# Patient Record
Sex: Male | Born: 2005
Health system: Southern US, Community
[De-identification: ages and names within clinical notes are randomized; demographics above are authoritative.]

---
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
## 2024-03-30 ENCOUNTER — Emergency Department (HOSPITAL_COMMUNITY): Admission: EM | Admit: 2024-03-30 | Discharge: 2024-03-30 | Disposition: A | Discharge status: Home / Self Care

## 2024-03-30 ENCOUNTER — Other Ambulatory Visit: Payer: Self-pay

## 2024-03-30 ENCOUNTER — Encounter (HOSPITAL_COMMUNITY): Payer: Self-pay

## 2024-03-30 ENCOUNTER — Emergency Department (HOSPITAL_COMMUNITY)

## 2024-03-30 DIAGNOSIS — N451 Epididymitis: Secondary | ICD-10-CM | POA: Insufficient documentation

## 2024-03-30 DIAGNOSIS — N50812 Left testicular pain: Secondary | ICD-10-CM | POA: Diagnosis present

## 2024-03-30 LAB — BASIC METABOLIC PANEL WITH GFR
Anion gap: 11 (ref 5–15)
BUN: 12 mg/dL (ref 6–20)
CO2: 25 mmol/L (ref 22–32)
Calcium: 9.6 mg/dL (ref 8.9–10.3)
Chloride: 103 mmol/L (ref 98–111)
Creatinine, Ser: 0.85 mg/dL (ref 0.61–1.24)
GFR, Estimated: >60 mL/min
Glucose, Bld: 109 mg/dL — ABNORMAL HIGH (ref 70–99)
Potassium: 4 mmol/L (ref 3.5–5.1)
Sodium: 140 mmol/L (ref 135–145)

## 2024-03-30 LAB — CBC WITH DIFFERENTIAL/PLATELET
Abs Immature Granulocytes: 0.03 K/uL (ref 0.00–0.07)
Basophils Absolute: 0 K/uL (ref 0.0–0.1)
Basophils Relative: 0 %
Eosinophils Absolute: 0 K/uL (ref 0.0–0.5)
Eosinophils Relative: 0 %
HCT: 43.1 % (ref 39.0–52.0)
Hemoglobin: 15.3 g/dL (ref 13.0–17.0)
Immature Granulocytes: 0 %
Lymphocytes Relative: 14 %
Lymphs Abs: 1.5 K/uL (ref 0.7–4.0)
MCH: 30.8 pg (ref 26.0–34.0)
MCHC: 35.5 g/dL (ref 30.0–36.0)
MCV: 86.7 fL (ref 80.0–100.0)
Monocytes Absolute: 0.6 K/uL (ref 0.1–1.0)
Monocytes Relative: 6 %
Neutro Abs: 8.1 K/uL — ABNORMAL HIGH (ref 1.7–7.7)
Neutrophils Relative %: 80 %
Platelets: 274 K/uL (ref 150–400)
RBC: 4.97 MIL/uL (ref 4.22–5.81)
RDW: 12.5 % (ref 11.5–15.5)
WBC: 10.3 K/uL (ref 4.0–10.5)
nRBC: 0 % (ref 0.0–0.2)

## 2024-03-30 LAB — URINALYSIS, ROUTINE W REFLEX MICROSCOPIC
Bilirubin Urine: NEGATIVE
Glucose, UA: NEGATIVE mg/dL
Hgb urine dipstick: NEGATIVE
Ketones, ur: NEGATIVE mg/dL
Leukocytes,Ua: NEGATIVE
Nitrite: NEGATIVE
Protein, ur: 30 mg/dL — AB
Specific Gravity, Urine: 1.02 (ref 1.005–1.030)
pH: 9 — ABNORMAL HIGH (ref 5.0–8.0)

## 2024-03-30 MED ORDER — DOXYCYCLINE HYCLATE 100 MG PO TABS
100.0000 mg | ORAL_TABLET | Freq: Once | ORAL | Status: AC
  Administered 2024-03-30: 100 mg via ORAL
  Filled 2024-03-30: qty 1

## 2024-03-30 MED ORDER — LIDOCAINE HCL (PF) 1 % IJ SOLN
INTRAMUSCULAR | Status: AC
  Administered 2024-03-30: 5 mL
  Filled 2024-03-30: qty 5

## 2024-03-30 MED ORDER — DOXYCYCLINE HYCLATE 100 MG PO CAPS: 100.0000 mg | ORAL_CAPSULE | Freq: Two times a day (BID) | ORAL | 0 refills | Status: DC

## 2024-03-30 MED ORDER — CEFTRIAXONE SODIUM 1 G IJ SOLR
1.0000 g | Freq: Once | INTRAMUSCULAR | Status: AC
  Administered 2024-03-30: 1 g via INTRAMUSCULAR
  Filled 2024-03-30: qty 10

## 2024-03-30 MED ORDER — HYDROCODONE-ACETAMINOPHEN 5-325 MG PO TABS
1.0000 | ORAL_TABLET | Freq: Once | ORAL | Status: AC
  Administered 2024-03-30: 1 via ORAL
  Filled 2024-03-30: qty 1

## 2024-03-30 NOTE — ED Provider Triage Note (Signed)
 Emergency Medicine Provider Triage Evaluation Note  Lucas Johnson , a 19 y.o. male  was evaluated in triage.  Pt complains of left testicle pain onset 11am this morning, constant, no changes in bowel or bladder habits.  Review of Systems  Positive:  Negative:   Physical Exam  BP (!) 140/75 (BP Location: Right Arm)   Pulse 88   Temp 97.7 F (36.5 C)   Resp 16   Ht 5' 11 (1.803 m)   Wt 79.4 kg   SpO2 100%   BMI 24.41 kg/m  Gen:   Awake, no distress   Resp:  Normal effort  MSK:   Moves extremities without difficulty  Other:    Medical Decision Making  Medically screening exam initiated at 1:26 PM.  Appropriate orders placed.  Lucas Johnson was informed that the remainder of the evaluation will be completed by another provider, this initial triage assessment does not replace that evaluation, and the importance of remaining in the ED until their evaluation is complete.     Beverley Leita LABOR, PA-C 03/30/24 1327

## 2024-03-30 NOTE — ED Provider Notes (Signed)
 " [place] [facility] AT [facility] Provider Note   CSN: [id number] Arrival date & time: 03/30/24  1247     Patient presents with: Abdominal Pain   Lucas Johnson is a 19 y.o. male.   19 year old male presents for evaluation of left lower quadrant abdominal pain and left testicular pain that started acutely today.  He states that slightly improved.  Denies any dysuria.  Denies any nausea or vomiting or diarrhea.  Denies any other symptoms or concerns.   Abdominal Pain Associated symptoms: no chest pain, no chills, no cough, no dysuria, no fever, no hematuria, no shortness of breath, no sore throat and no vomiting        Prior to Admission medications  Medication Sig Start Date End Date Taking? Authorizing Provider  doxycycline (VIBRAMYCIN) 100 MG capsule Take 1 capsule (100 mg total) by mouth 2 (two) times daily for 7 days. 03/30/24 04/06/24 Yes Areebah Meinders L, DO  Cetirizine  HCl 10 MG CAPS Take 1 capsule (10 mg total) by mouth daily for 10 days. 05/21/20 05/31/20  Wieters, Hallie C, PA-C  fluticasone  (FLONASE ) 50 MCG/ACT nasal spray Place 1-2 sprays into both nostrils daily. 05/21/20   Wieters, Hallie C, PA-C  ibuprofen  (ADVIL ) 400 MG tablet Take 1 tablet (400 mg total) by mouth every 6 (six) hours as needed. 05/21/20   Wieters, Hallie C, PA-C    Allergies: Patient has no known allergies.    Review of Systems  Constitutional:  Negative for chills and fever.  HENT:  Negative for ear pain and sore throat.   Eyes:  Negative for pain and visual disturbance.  Respiratory:  Negative for cough and shortness of breath.   Cardiovascular:  Negative for chest pain and palpitations.  Gastrointestinal:  Positive for abdominal pain. Negative for vomiting.  Genitourinary:  Negative for dysuria and hematuria.  Musculoskeletal:  Negative for arthralgias and back pain.  Skin:  Negative for color change and rash.  Neurological:  Negative for seizures and syncope.  All other  systems reviewed and are negative.   Updated Vital Signs BP 130/64 (BP Location: Left Arm)   Pulse 77   Temp 98.7 F (37.1 C)   Resp 16   Ht 5' 11 (1.803 m)   Wt 79.4 kg   SpO2 100%   BMI 24.41 kg/m   Physical Exam Vitals and nursing note reviewed.  Constitutional:      General: He is not in acute distress.    Appearance: He is well-developed.  HENT:     Head: Normocephalic and atraumatic.  Eyes:     Conjunctiva/sclera: Conjunctivae normal.  Cardiovascular:     Rate and Rhythm: Normal rate and regular rhythm.     Heart sounds: No murmur heard. Pulmonary:     Effort: Pulmonary effort is normal. No respiratory distress.     Breath sounds: Normal breath sounds.  Abdominal:     Palpations: Abdomen is soft.     Tenderness: There is abdominal tenderness in the left lower quadrant.  Musculoskeletal:        General: No swelling.     Cervical back: Neck supple.  Skin:    General: Skin is warm and dry.     Capillary Refill: Capillary refill takes less than 2 seconds.  Neurological:     Mental Status: He is alert.  Psychiatric:        Mood and Affect: Mood normal.     (all labs ordered are listed, but only abnormal  results are displayed) Labs Reviewed  CBC WITH DIFFERENTIAL/PLATELET - Abnormal; Notable for the following components:      Result Value   Neutro Abs 8.1 (*)    All other components within normal limits  BASIC METABOLIC PANEL WITH GFR - Abnormal; Notable for the following components:   Glucose, Bld 109 (*)    All other components within normal limits  URINALYSIS, ROUTINE W REFLEX MICROSCOPIC - Abnormal; Notable for the following components:   APPearance TURBID (*)    pH 9.0 (*)    Protein, ur 30 (*)    Bacteria, UA RARE (*)    All other components within normal limits    EKG: None  Radiology: US  SCROTUM W/DOPPLER Result Date: 03/30/2024 CLINICAL DATA:  Left testicle pain EXAM: SCROTAL ULTRASOUND DOPPLER ULTRASOUND OF THE TESTICLES TECHNIQUE:  Complete ultrasound examination of the testicles, epididymis, and other scrotal structures was performed. Color and spectral Doppler ultrasound were also utilized to evaluate blood flow to the testicles. COMPARISON:  None Available. FINDINGS: Right testicle Measurements: 4.3 x 2.1 x 3.3 cm. No mass or microlithiasis visualized. Doppler: There is normal vascularity on color doppler examination. Spectral doppler arterial and venous waveforms are normal. Left testicle Measurements:  4.1 x 2.5 x 3.7 cm. No mass or microlithiasis visualized. Doppler: There is normal vascularity on color doppler examination. Spectral doppler arterial and venous waveforms are normal. Right epididymis:  Normal in size and appearance. Left epididymis: Slightly prominent in size. Heterogenous thickened appearance of the left spermatic cord with slight hyperemia. Hydrocele:  None visualized. Varicocele:  None visualized. IMPRESSION: 1. Negative for testicular torsion or intratesticular mass. 2. Slightly prominent left epididymis with heterogenous thickened appearance of the left spermatic cord with slight hyperemia, findings could be seen with mild epididymitis/funiculitis Electronically Signed   By: Luke Bun M.D.   On: 03/30/2024 15:55     Procedures   Medications Ordered in the ED  cefTRIAXone (ROCEPHIN) injection 1 g (has no administration in time range)  doxycycline (VIBRA-TABS) tablet 100 mg (has no administration in time range)  HYDROcodone-acetaminophen  (NORCO/VICODIN) 5-325 MG per tablet 1 tablet (1 tablet Oral Given 03/30/24 1346)                                    Medical Decision Making Patient with normal lab workup but found to have epididymitis on his testicular ultrasound.  He appears well otherwise.  Advised Tylenol  Motrin  as needed for pain will give him Rocephin here and start him on doxycycline.  All results and plan discussed with patient and family at bedside.  He is advised close follow-up with his  primary care doctor and otherwise return to the ER for any new or worsening symptoms.  Patient and family feel comfortable with the plan for him to be discharged home.  Problems Addressed: Epididymitis: acute illness or injury  Amount and/or Complexity of Data Reviewed External Data Reviewed: notes.    Details: No prior ER records for review Labs: ordered. Decision-making details documented in ED Course.    Details: Ordered and reviewed by me and unremarkable Radiology: ordered and independent interpretation performed. Decision-making details documented in ED Course.    Details: Ordered and interpreted by me independently of radiology Testicular ultrasound shows evidence of epididymitis, but no testicular torsion  Risk OTC drugs. Prescription drug management.     Final diagnoses:  Epididymitis    ED Discharge Orders  Ordered    doxycycline (VIBRAMYCIN) 100 MG capsule  2 times daily        03/30/24 1801               Zanovia Rotz L, DO 03/30/24 1805  "

## 2024-03-30 NOTE — ED Notes (Signed)
 This NT gave pt a urine cup and pt stated they were not able to urinate at this time. Pt encouraged to give urine sample as soon as possible.

## 2024-03-30 NOTE — ED Triage Notes (Signed)
 Patient is having LLQ pain for the past 2 hours. Denies nausea, vomiting, diarrhea. He states he is unsure of if he has had a fever. Last BM was about a hour and half ago and it was normal.

## 2024-03-30 NOTE — ED Triage Notes (Signed)
 Patient endorses sudden onset of LLQ and left testicle pain starting around 11am. Patient denies strenuous activity and swelling.

## 2024-03-30 NOTE — Discharge Instructions (Signed)
 Take your antibiotics as prescribed.  You can use Tylenol  Motrin  as needed for pain.

## 2024-03-31 ENCOUNTER — Telehealth (HOSPITAL_COMMUNITY): Payer: Self-pay | Admitting: Emergency Medicine

## 2024-03-31 MED ORDER — DOXYCYCLINE HYCLATE 100 MG PO CAPS: 100.0000 mg | ORAL_CAPSULE | Freq: Two times a day (BID) | ORAL | 0 refills | Status: AC

## 2024-03-31 NOTE — Telephone Encounter (Cosign Needed)
 Sent to different pharmacy
